# Patient Record
Sex: Female | Born: 1980 | Race: Asian | Hispanic: No | Marital: Single | Smoking: Never smoker
Health system: Southern US, Community
[De-identification: ages and names within clinical notes are randomized; demographics above are authoritative.]

## PROBLEM LIST (undated history)

## (undated) DIAGNOSIS — T7840XA Allergy, unspecified, initial encounter: Secondary | ICD-10-CM

## (undated) HISTORY — PX: WISDOM TOOTH EXTRACTION: SHX21

## (undated) HISTORY — DX: Allergy, unspecified, initial encounter: T78.40XA

---

## 2006-08-08 ENCOUNTER — Emergency Department (HOSPITAL_COMMUNITY): Admission: EM | Admit: 2006-08-08 | Discharge: 2006-08-08 | Payer: Self-pay | Admitting: Family Medicine

## 2008-01-12 ENCOUNTER — Emergency Department (HOSPITAL_COMMUNITY): Admission: EM | Admit: 2008-01-12 | Discharge: 2008-01-12 | Payer: Self-pay | Admitting: Family Medicine

## 2008-04-29 ENCOUNTER — Emergency Department (HOSPITAL_COMMUNITY): Admission: EM | Admit: 2008-04-29 | Discharge: 2008-04-29 | Payer: Self-pay | Admitting: Family Medicine

## 2008-08-14 ENCOUNTER — Emergency Department (HOSPITAL_COMMUNITY): Admission: EM | Admit: 2008-08-14 | Discharge: 2008-08-14 | Payer: Self-pay | Admitting: Family Medicine

## 2009-02-11 ENCOUNTER — Emergency Department (HOSPITAL_COMMUNITY): Admission: EM | Admit: 2009-02-11 | Discharge: 2009-02-11 | Payer: Self-pay | Admitting: Emergency Medicine

## 2009-02-16 ENCOUNTER — Emergency Department (HOSPITAL_COMMUNITY): Admission: EM | Admit: 2009-02-16 | Discharge: 2009-02-16 | Payer: Self-pay | Admitting: Family Medicine

## 2009-09-27 ENCOUNTER — Emergency Department (HOSPITAL_COMMUNITY): Admission: EM | Admit: 2009-09-27 | Discharge: 2009-09-27 | Payer: Self-pay | Admitting: Family Medicine

## 2009-12-29 ENCOUNTER — Emergency Department (HOSPITAL_COMMUNITY): Admission: EM | Admit: 2009-12-29 | Discharge: 2009-12-29 | Payer: Self-pay | Admitting: Family Medicine

## 2010-01-18 ENCOUNTER — Emergency Department (HOSPITAL_COMMUNITY): Admission: EM | Admit: 2010-01-18 | Discharge: 2010-01-18 | Payer: Self-pay | Admitting: Family Medicine

## 2010-04-03 ENCOUNTER — Emergency Department (HOSPITAL_COMMUNITY): Admission: EM | Admit: 2010-04-03 | Discharge: 2010-04-03 | Payer: Self-pay | Admitting: Family Medicine

## 2010-06-04 ENCOUNTER — Emergency Department (HOSPITAL_COMMUNITY): Admission: EM | Admit: 2010-06-04 | Discharge: 2010-06-04 | Payer: Self-pay | Admitting: Emergency Medicine

## 2011-03-10 LAB — POCT RAPID STREP A (OFFICE): Streptococcus, Group A Screen (Direct): NEGATIVE

## 2011-04-30 IMAGING — CR DG ANKLE COMPLETE 3+V*L*
3 series · 3 of 3 positions shown · non-contrast
Comparison: None

CLINICAL DATA: Left ankle injury.

LEFT ANKLE COMPLETE - 3+ VIEW

[view not recorded (1 of 3)]
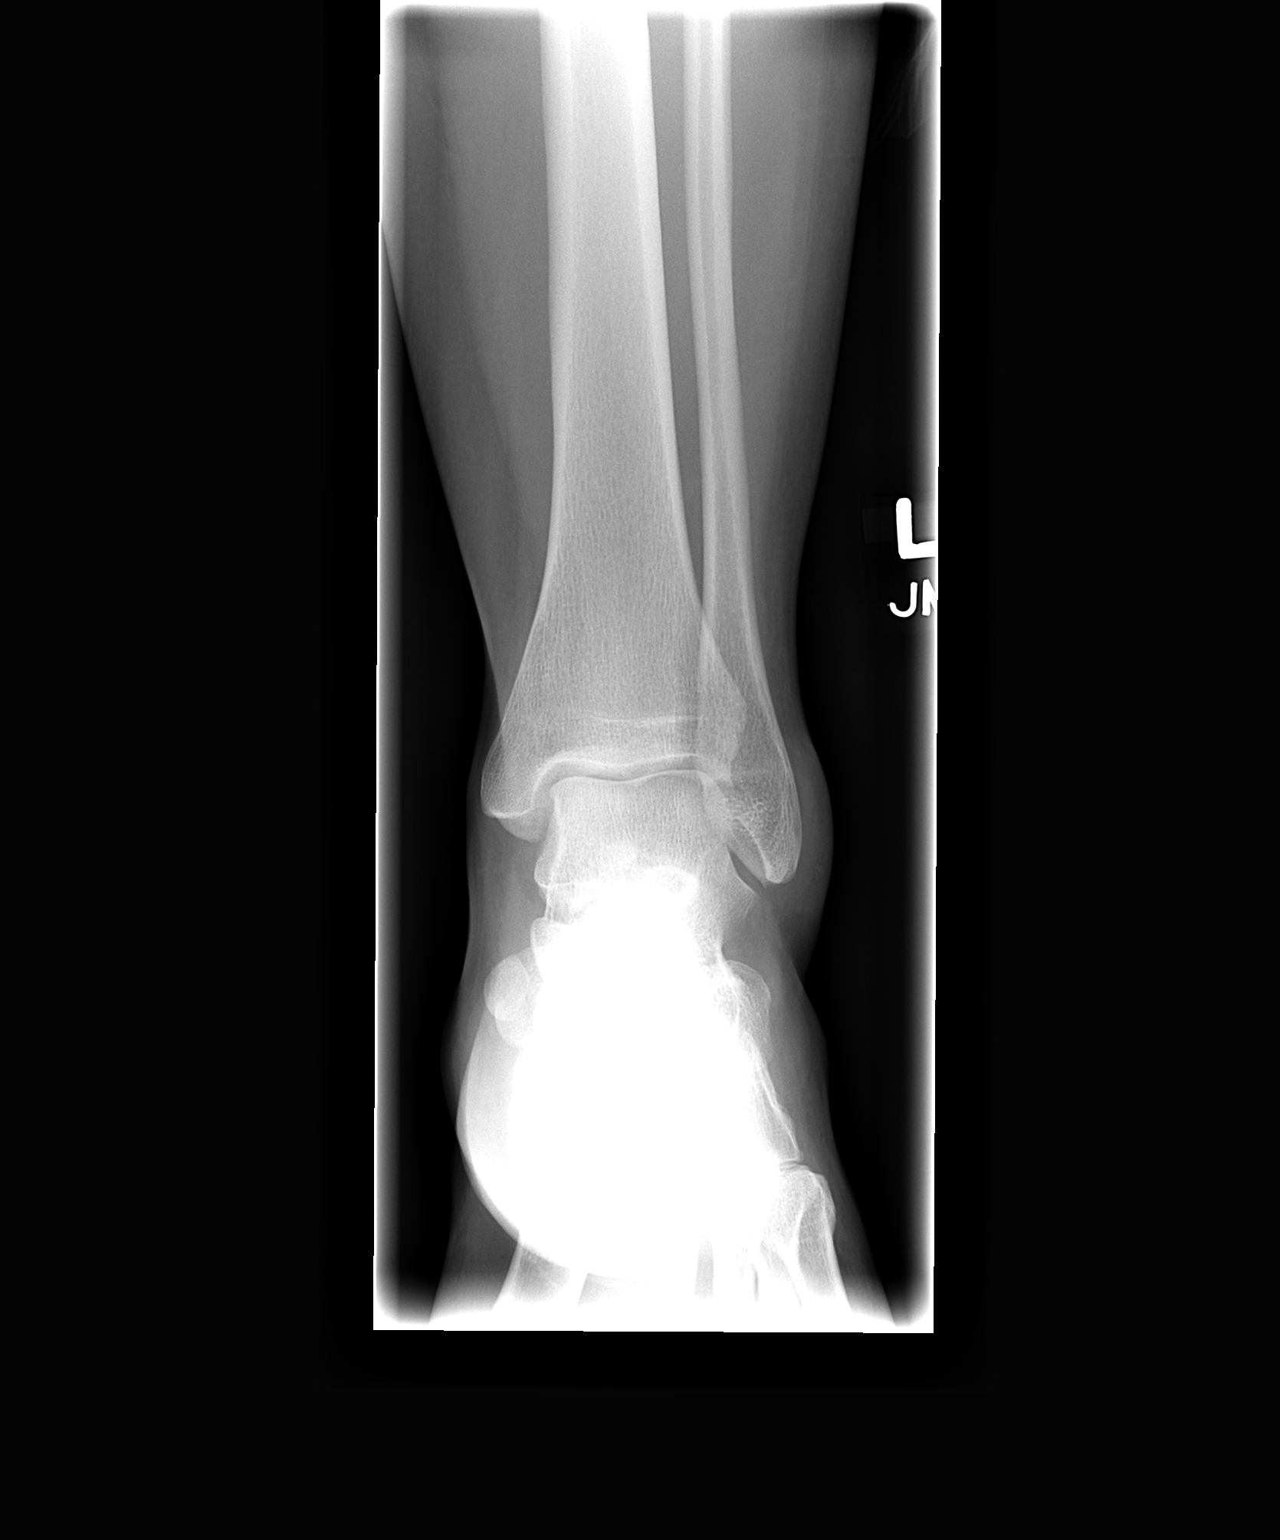

[view not recorded (2 of 3)]
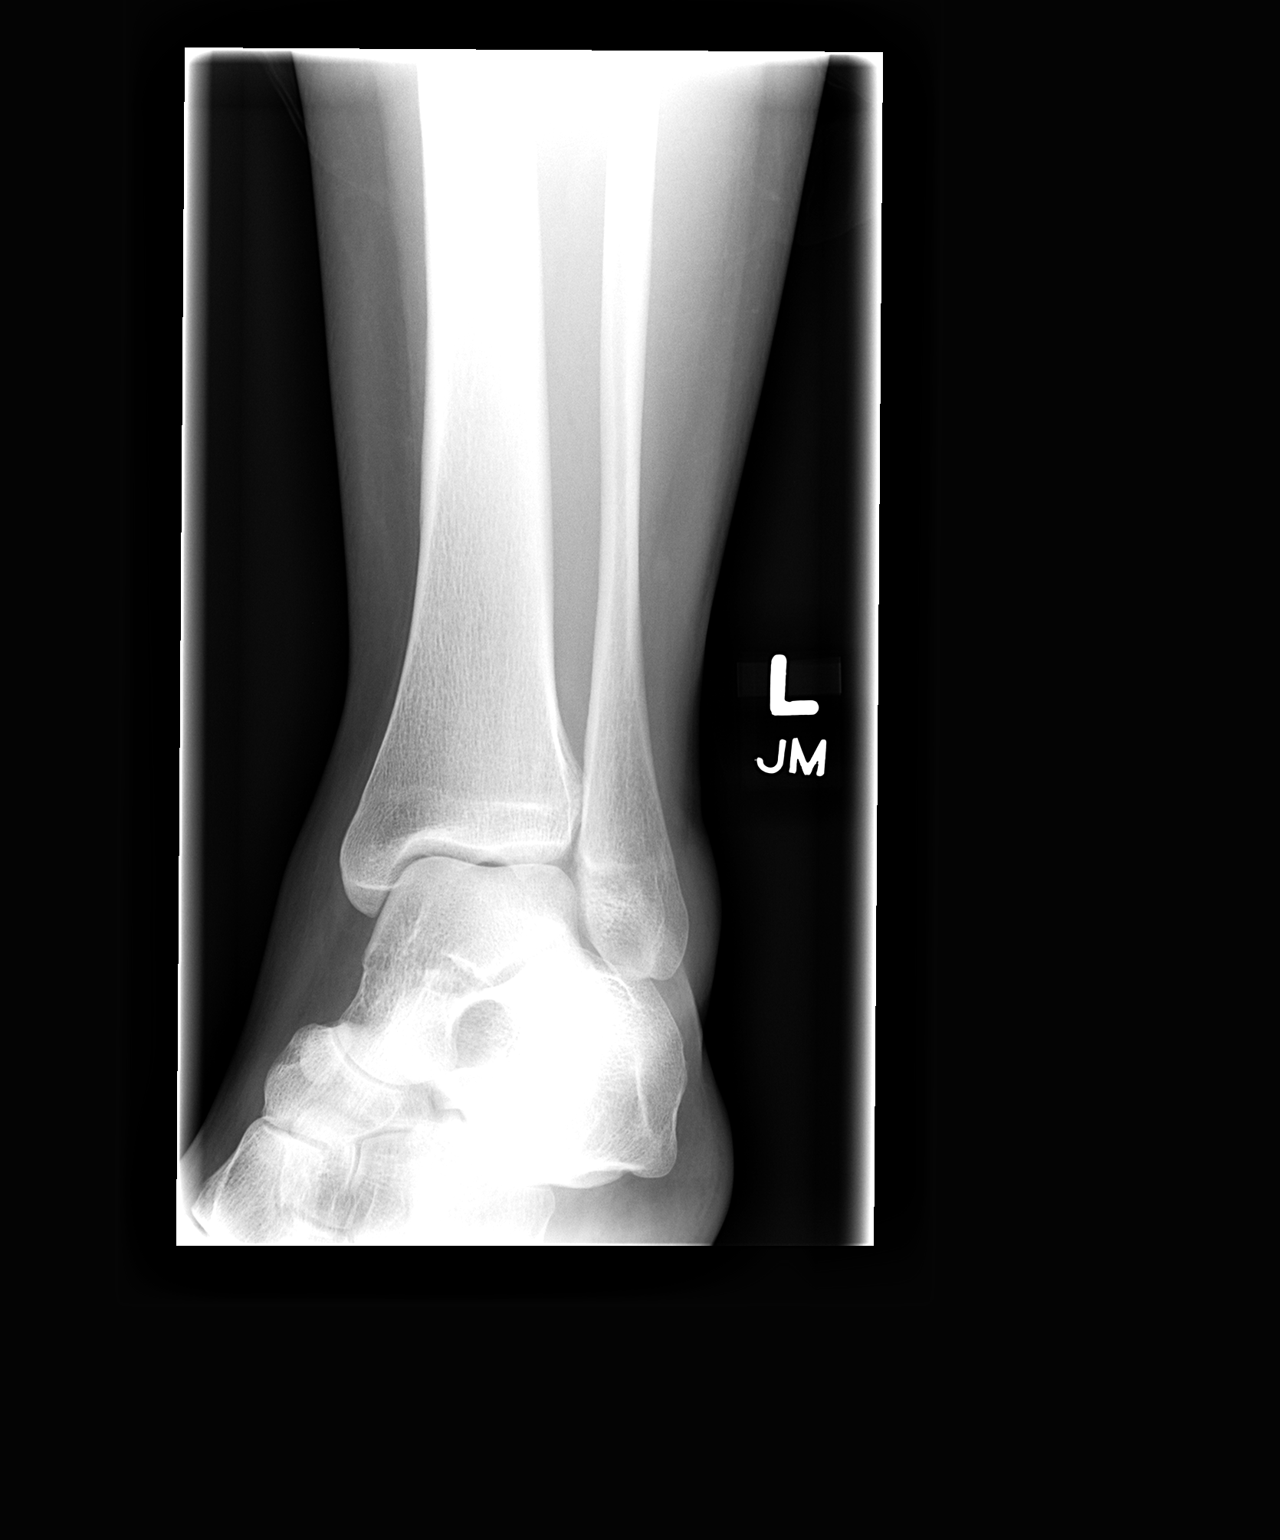

[view not recorded (3 of 3)]
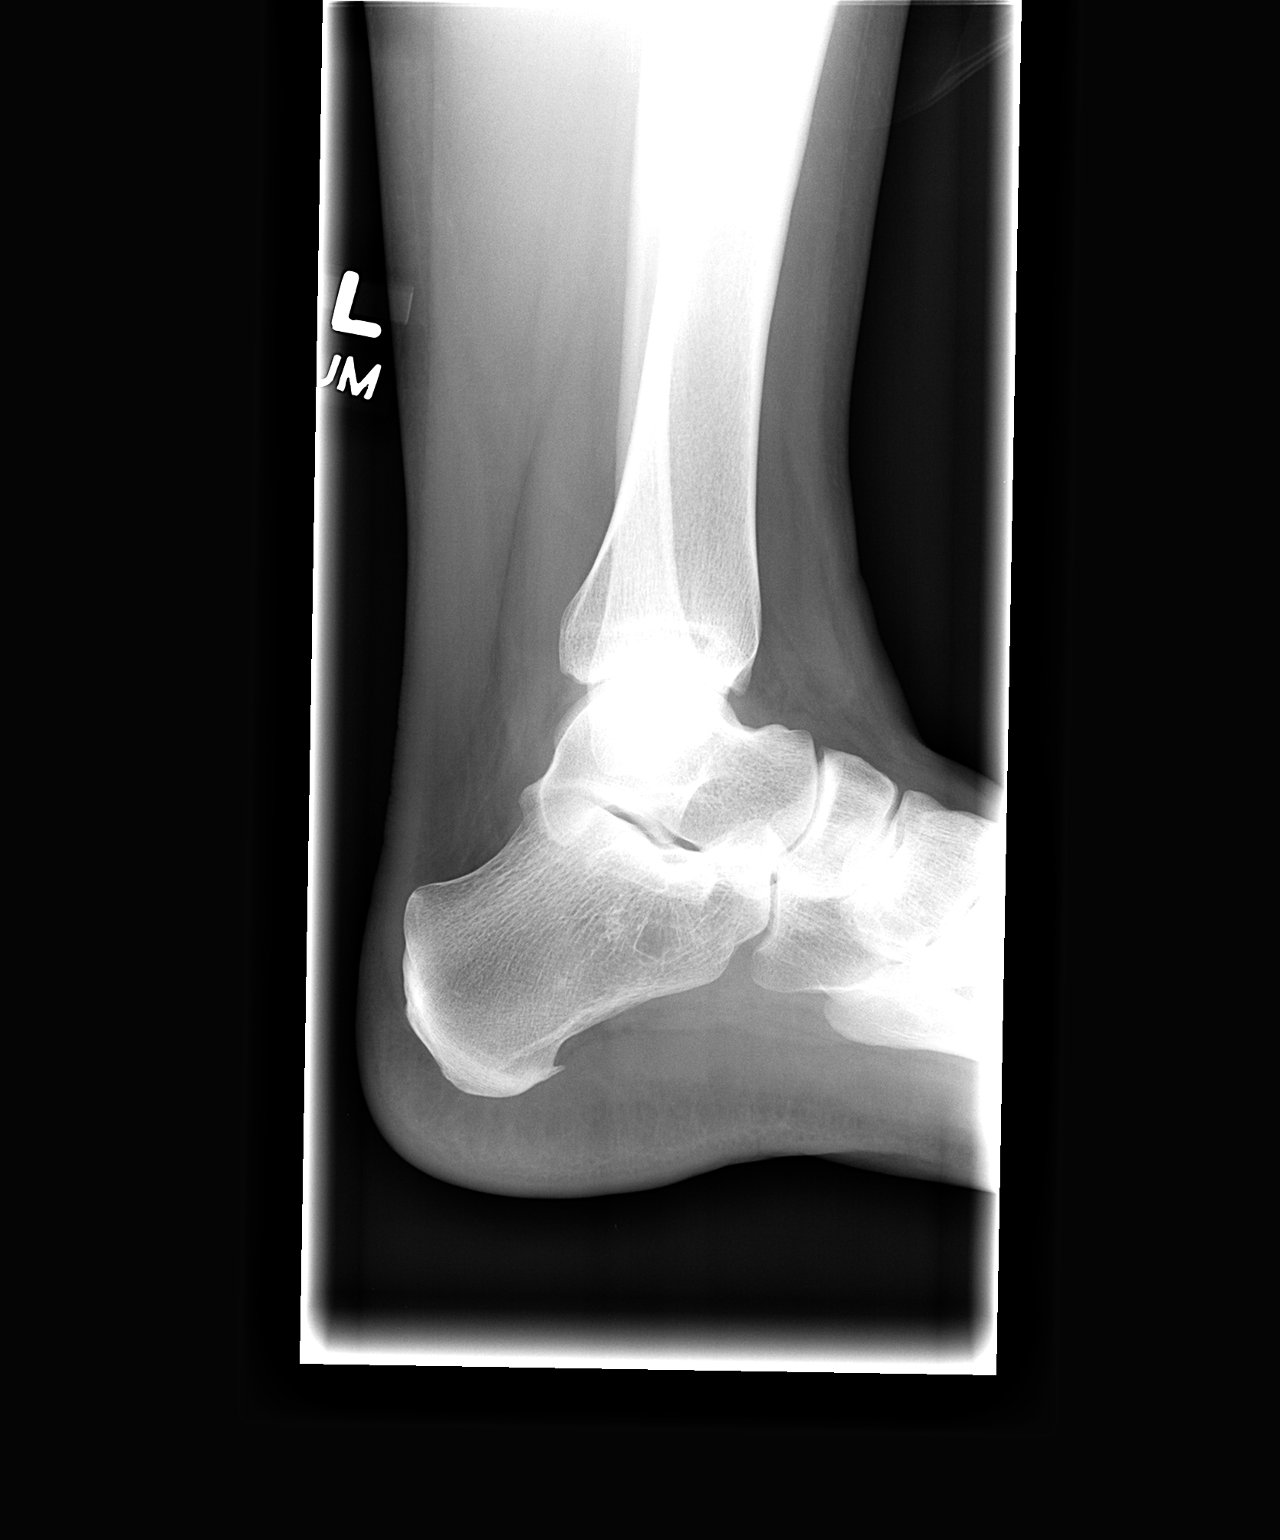

[3 of 3 positions shown; findings below may reference images not displayed]

FINDINGS: Three views of the left ankle were obtained.  There is a
small spur along the plantar aspect of the calcaneus.  Normal
alignment of the left ankle without fracture or dislocation. Soft
tissue swelling along the lateral ankle without acute fracture.
IMPRESSION: No acute bony abnormality to the left ankle.

## 2012-05-23 ENCOUNTER — Other Ambulatory Visit: Payer: Self-pay | Admitting: Obstetrics and Gynecology

## 2012-05-23 ENCOUNTER — Other Ambulatory Visit (HOSPITAL_COMMUNITY)
Admission: RE | Admit: 2012-05-23 | Discharge: 2012-05-23 | Disposition: A | Discharge status: Home / Self Care | Payer: PRIVATE HEALTH INSURANCE | Source: Ambulatory Visit | Attending: Obstetrics and Gynecology | Admitting: Obstetrics and Gynecology

## 2012-05-23 DIAGNOSIS — Z01419 Encounter for gynecological examination (general) (routine) without abnormal findings: Secondary | ICD-10-CM | POA: Insufficient documentation

## 2012-11-30 ENCOUNTER — Other Ambulatory Visit: Payer: Self-pay | Admitting: Obstetrics and Gynecology

## 2012-12-14 ENCOUNTER — Ambulatory Visit (INDEPENDENT_AMBULATORY_CARE_PROVIDER_SITE_OTHER): Payer: PRIVATE HEALTH INSURANCE | Admitting: Family Medicine

## 2012-12-14 VITALS — BP 144/95 | HR 58 | Temp 98.1°F | Resp 16 | Ht 67.0 in | Wt 173.8 lb

## 2012-12-14 DIAGNOSIS — J329 Chronic sinusitis, unspecified: Secondary | ICD-10-CM

## 2012-12-14 DIAGNOSIS — R05 Cough: Secondary | ICD-10-CM

## 2012-12-14 DIAGNOSIS — J40 Bronchitis, not specified as acute or chronic: Secondary | ICD-10-CM

## 2012-12-14 MED ORDER — AZITHROMYCIN 250 MG PO TABS: ORAL_TABLET | ORAL | Status: DC

## 2012-12-14 MED ORDER — HYDROCODONE-HOMATROPINE 5-1.5 MG/5ML PO SYRP: 5.0000 mL | ORAL_SOLUTION | Freq: Three times a day (TID) | ORAL | Status: DC | PRN

## 2012-12-14 NOTE — Progress Notes (Signed)
[facility] [address] [place] [address] [phone]  Date:  12/14/2012   Name:  KELCI PETRELLA   DOB:  June 01, 1981   MRN:  [id number]  PCP:  No primary provider on file.    Chief Complaint: Cough and Sore Throat   History of Present Illness:  VERMELLE CAMMARATA is a 32 y.o. very pleasant female patient who presents with the following:  She is here with illness for the last 10 days or so.  It seemed to be getting better, but then her symptoms came back.  She is coughing a lot- worse at night.  The cough is productive of clear mucus.  She also notes ear discomfort and congestion.     She has not noted a fever, no aches or chills- she did have aches and chills at the start of her illness.    No GI symptoms.   She does have a mild ST.  She notes sinus congestion especially in the am.   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    She is generally healthy LMP was about 6 weeks ago- she does extended regimen OCP.    There is no problem list on file for this patient.   Past Medical History  Diagnosis Date  . Allergy     Past Surgical History  Procedure Date  . Wisdom tooth extraction     History  Substance Use Topics  . Smoking status: Never Smoker   . Smokeless tobacco: Not on file  . Alcohol Use: Not on file    Family History  Problem Relation Age of Onset  . Hyperlipidemia Mother   . Hypertension Mother   . Diabetes Mother   . Diabetes Father   . Hypertension Father   . Hyperlipidemia Father   . Heart disease Father     Allergies  Allergen Reactions  . Penicillins     Medication list has been reviewed and updated.  Current Outpatient Prescriptions on File Prior to Visit  Medication Sig Dispense Refill  . Calcium Carbonate-Vitamin D (CALCIUM-VITAMIN D) 500-200 MG-UNIT per tablet Take 1 tablet by mouth 2 (two) times daily with a meal.      . cetirizine (ZYRTEC) 10 MG tablet Take 10 mg by mouth  daily.      Marland Kitchen levonorgestrel-ethinyl estradiol (SEASONALE,INTROVALE,JOLESSA) 0.15-0.03 MG tablet Take 1 tablet by mouth daily.        Review of Systems:  As per HPI- otherwise negative.   Physical Examination: Filed Vitals:   12/14/12 0932  BP: 144/95  Pulse: 58  Temp: 98.1 F (36.7 C)  Resp: 16   Filed Vitals:   12/14/12 0932  Height: 5\' 7"  (1.702 m)  Weight: 173 lb 12.8 oz (78.835 kg)   Body mass index is 27.22 kg/(m^2). Ideal Body Weight: Weight in (lb) to have BMI = 25: 159.3   GEN: WDWN, NAD, Non-toxic, A & O x 3 HEENT: Atraumatic, Normocephalic. Neck supple. No masses, No LAD. Bilateral TM wnl, oropharynx normal.  PEERL,EOMI.   Ears and Nose: No external deformity. CV: RRR, No M/G/R. No JVD. No thrill. No extra heart sounds. PULM: CTA B, no wheezes or rhonchi. No retractions. No resp. distress. No accessory muscle use.slight crackels right base ABD: S, NT, ND, +BS. No rebound. No HSM. EXTR: No c/c/e NEURO Normal gait.  PSYCH: Normally interactive. Conversant. Not depressed or anxious appearing.  Calm demeanor.    Assessment and  Plan: 1. Bronchitis  azithromycin (ZITHROMAX) 250 MG tablet, HYDROcodone-homatropine (HYCODAN) 5-1.5 MG/5ML syrup  2. Cough  HYDROcodone-homatropine (HYCODAN) 5-1.5 MG/5ML syrup  3. Sinusitis  azithromycin (ZITHROMAX) 250 MG tablet   Treat as above for sinusitis and bronchitis. Cautioned about combination of OCP and abx.  Let me know if not better, Sooner if worse.     Abbe Amsterdam, MD

## 2012-12-14 NOTE — Patient Instructions (Addendum)
Use the azithromycin as directed and the cough syrup as needed.  Let us know if you are not better in the next few days- Sooner if worse.

## 2013-01-28 ENCOUNTER — Other Ambulatory Visit: Payer: Self-pay | Admitting: Obstetrics and Gynecology

## 2013-02-27 ENCOUNTER — Ambulatory Visit (INDEPENDENT_AMBULATORY_CARE_PROVIDER_SITE_OTHER): Payer: PRIVATE HEALTH INSURANCE | Admitting: Family Medicine

## 2013-02-27 VITALS — BP 102/84 | HR 76 | Temp 98.6°F | Resp 16 | Ht 67.0 in | Wt 173.2 lb

## 2013-02-27 DIAGNOSIS — J029 Acute pharyngitis, unspecified: Secondary | ICD-10-CM

## 2013-02-27 DIAGNOSIS — J069 Acute upper respiratory infection, unspecified: Secondary | ICD-10-CM

## 2013-02-27 DIAGNOSIS — J020 Streptococcal pharyngitis: Secondary | ICD-10-CM

## 2013-02-27 DIAGNOSIS — R05 Cough: Secondary | ICD-10-CM

## 2013-02-27 DIAGNOSIS — J40 Bronchitis, not specified as acute or chronic: Secondary | ICD-10-CM

## 2013-02-27 LAB — POCT INFLUENZA A/B: Influenza B, POC: NEGATIVE

## 2013-02-27 LAB — POCT RAPID STREP A (OFFICE): Rapid Strep A Screen: NEGATIVE

## 2013-02-27 MED ORDER — IPRATROPIUM BROMIDE 0.03 % NA SOLN: 2.0000 | Freq: Two times a day (BID) | NASAL | Status: DC

## 2013-02-27 MED ORDER — HYDROCODONE-HOMATROPINE 5-1.5 MG/5ML PO SYRP: 5.0000 mL | ORAL_SOLUTION | Freq: Three times a day (TID) | ORAL | Status: DC | PRN

## 2013-02-27 MED ORDER — MUCINEX DM 30-600 MG PO TB12: 1.0000 | ORAL_TABLET | Freq: Two times a day (BID) | ORAL | Status: DC | PRN

## 2013-02-27 NOTE — Progress Notes (Signed)
[address]   [place], [place]  [address]   [phone]  Subjective:    Patient ID: Carrie Petersen, female    DOB: September 19, 1981, 31 y.o.   MRN: [id number]  HPI This 32 y.o. female presents for evaluation of sore throat, cough.  Onset three days ago with coughing.  Was in car with smokers on day before onset.  Scratchy throat x three days ago.  +ST two days ago; +pain with swallowing; no drooling or spitting; +body aches and malaise started today.  Coughing up brown sputum this morning.  No fever but +chills; no sweats.  +HA.  +ear pain L>R; +rhinorrhea clear; +nasal congestion.  +PND; +coughing; no SOB; no sneezing; no itchy eyes or nose.  Taking Nyquil or Dayquil.  No nausea, vomiting, diarrhea.  No rash.  No sick contacts.  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  S/p flu vaccine.  Was around nephew this weekend but not sure if he is sick. Takes Zyrtec daily for allergic rhinitis.  Non-smoker.  PCP:  [facility] [address].     Review of Systems  Constitutional: Positive for chills. Negative for fever, diaphoresis and fatigue.  HENT: Positive for ear pain, congestion, sore throat, rhinorrhea, trouble swallowing and postnasal drip. Negative for sneezing and voice change.   Eyes: Negative for itching.  Respiratory: Positive for cough. Negative for shortness of breath and wheezing.   Gastrointestinal: Negative for nausea, vomiting and diarrhea.  Skin: Negative for rash.  Neurological: Positive for headaches.        Past Medical History  Diagnosis Date  . Allergy     Past Surgical History  Procedure Laterality Date  . Wisdom tooth extraction      Prior to Admission medications   Medication Sig Start Date End Date Taking? Authorizing Provider  Ascorbic Acid (VITAMIN C) 100 MG tablet Take 100 mg by mouth daily.   Yes Historical Provider, MD  BIOTIN PO Take 1 tablet by mouth daily.   Yes Historical Provider, MD  Calcium Carbonate-Vitamin D (CALCIUM-VITAMIN D)  500-200 MG-UNIT per tablet Take 1 tablet by mouth 2 (two) times daily with a meal.   Yes Historical Provider, MD  cetirizine (ZYRTEC) 10 MG tablet Take 10 mg by mouth daily.   Yes Historical Provider, MD  fish oil-omega-3 fatty acids 1000 MG capsule Take 2 g by mouth daily.   Yes Historical Provider, MD  levonorgestrel-ethinyl estradiol (SEASONALE,INTROVALE,JOLESSA) 0.15-0.03 MG tablet Take 1 tablet by mouth daily.   Yes Historical Provider, MD  Multiple Vitamin (MULTIVITAMIN) tablet Take 1 tablet by mouth daily.   Yes Historical Provider, MD  azithromycin (ZITHROMAX) 250 MG tablet Use as a zpack 12/14/12   Gwenlyn Found Copland, MD  HYDROcodone-homatropine (HYCODAN) 5-1.5 MG/5ML syrup Take 5 mLs by mouth every 8 (eight) hours as needed for cough. 12/14/12   Pearline Cables, MD    Allergies  Allergen Reactions  . Penicillins     History   Social History  . Marital Status: Single    Spouse Name: N/A    Number of Children: N/A  . Years of Education: N/A   Occupational History  . nurse     [employer] on Pediatric floor and Med/Surg floor   Social History Main Topics  . Smoking status: Never Smoker   . Smokeless tobacco: Not on file  . Alcohol Use: 1.0 oz/week    2 drink(s) per week  . Drug Use: No  . Sexually Active: Yes  Birth Control/ Protection: Pill   Other Topics Concern  . Not on file   Social History Narrative  . No narrative on file    Family History  Problem Relation Age of Onset  . Hyperlipidemia Mother   . Hypertension Mother   . Diabetes Mother   . Diabetes Father   . Hypertension Father   . Hyperlipidemia Father   . Heart disease Father     Objective:   Physical Exam  Nursing note and vitals reviewed. Constitutional: She is oriented to person, place, and time. She appears well-developed and well-nourished. No distress.  HENT:  Head: Normocephalic and atraumatic.  Right Ear: External ear normal.  Left Ear: External ear normal.  Nose: Nose normal.    Mouth/Throat: Oropharynx is clear and moist.  Eyes: Conjunctivae are normal. Pupils are equal, round, and reactive to light.  Neck: Normal range of motion. Neck supple.  Cardiovascular: Normal rate, regular rhythm and normal heart sounds.   Pulmonary/Chest: Effort normal and breath sounds normal.  Lymphadenopathy:    She has cervical adenopathy.  Neurological: She is alert and oriented to person, place, and time.  Skin: No rash noted. She is not diaphoretic.  Psychiatric: She has a normal mood and affect. Her behavior is normal.    Results for orders placed in visit on 02/27/13  POCT INFLUENZA A/B      Result Value Range   Influenza A, POC Negative     Influenza B, POC Negative    POCT RAPID STREP A (OFFICE)      Result Value Range   Rapid Strep A Screen Negative  Negative       Assessment & Plan:  URI:  New.  Send throat culture.  Supportive care with Mucinex DM, Atrovent nasal spray, Hycodan cough syrup qhs.  Recommend Ibuprofen PRN sore throat, myalgias.  RTC for acute worsening; call in five days if no improvement.    Meds ordered this encounter  Medications  . BIOTIN PO    Sig: Take 1 tablet by mouth daily.  Marland Kitchen Dextromethorphan-Guaifenesin (MUCINEX DM) 30-600 MG TB12    Sig: Take 1 tablet by mouth 2 (two) times daily as needed.    Dispense:  28 each    Refill:  0  . ipratropium (ATROVENT) 0.03 % nasal spray    Sig: Place 2 sprays into the nose 2 (two) times daily.    Dispense:  30 mL    Refill:  5  . HYDROcodone-homatropine (HYCODAN) 5-1.5 MG/5ML syrup    Sig: Take 5 mLs by mouth every 8 (eight) hours as needed for cough.    Dispense:  180 mL    Refill:  0

## 2013-02-27 NOTE — Patient Instructions (Addendum)
Cough - Plan: POCT Influenza A/B, POCT rapid strep A, Dextromethorphan-Guaifenesin (MUCINEX DM) 30-600 MG TB12, HYDROcodone-homatropine (HYCODAN) 5-1.5 MG/5ML syrup  URI (upper respiratory infection) - Plan: ipratropium (ATROVENT) 0.03 % nasal spray  Streptococcal sore throat - Plan: POCT Influenza A/B, POCT rapid strep A, Culture, Group A Strep  Bronchitis - Plan: HYDROcodone-homatropine (HYCODAN) 5-1.5 MG/5ML syrup    CALL IN FIVE DAYS IF NO IMPROVEMENT IN SORE THROAT OR COUGH.

## 2013-03-01 LAB — CULTURE, GROUP A STREP: Organism ID, Bacteria: NORMAL

## 2013-04-13 ENCOUNTER — Encounter (HOSPITAL_COMMUNITY): Payer: Self-pay

## 2013-04-13 ENCOUNTER — Emergency Department (INDEPENDENT_AMBULATORY_CARE_PROVIDER_SITE_OTHER)
Admission: EM | Admit: 2013-04-13 | Discharge: 2013-04-13 | Disposition: A | Discharge status: Home / Self Care | Payer: Self-pay | Source: Home / Self Care

## 2013-04-13 DIAGNOSIS — L929 Granulomatous disorder of the skin and subcutaneous tissue, unspecified: Secondary | ICD-10-CM

## 2013-04-13 DIAGNOSIS — L980 Pyogenic granuloma: Secondary | ICD-10-CM

## 2013-04-13 MED ORDER — CEPHALEXIN 500 MG PO CAPS: 500.0000 mg | ORAL_CAPSULE | Freq: Three times a day (TID) | ORAL | Status: DC

## 2013-04-13 NOTE — ED Notes (Signed)
Removed her navel piercing for skin treatment, and then replaced it after treatment over. Now developing pain , swelling; concerned for skin infection

## 2013-04-13 NOTE — ED Provider Notes (Signed)
History     CSN: [id number]  Arrival date & time 04/13/13  1607   None     Chief Complaint  Patient presents with  . Cellulitis    (Consider location/radiation/quality/duration/timing/severity/associated sxs/prior treatment) HPI Comments: Pleasant 32 year old female who is concerned about an area of thickening just superior to the umbilicus where her navel piercing was. She recently underwent cold  sculpting procedure to help lose weight in the anterior abdomen. She had to remove the umbilical jewelry prior to that procedure. Later she began and only started 3 days later she developed localized pain. She states it was a little harder to get the piercing through. She has not noticed any redness, drainage or other signs of infection.    Past Medical History  Diagnosis Date  . Allergy     Past Surgical History  Procedure Laterality Date  . Wisdom tooth extraction      Family History  Problem Relation Age of Onset  . Hyperlipidemia Mother   . Hypertension Mother   . Diabetes Mother   . Diabetes Father   . Hypertension Father   . Hyperlipidemia Father   . Heart disease Father     History  Substance Use Topics  . Smoking status: Never Smoker   . Smokeless tobacco: Not on file  . Alcohol Use: 1.0 oz/week    2 drink(s) per week    OB History   Grav Para Term Preterm Abortions TAB SAB Ect Mult Living                  Review of Systems  Constitutional: Negative.   Respiratory: Negative.   Gastrointestinal: Negative.   Musculoskeletal: Negative.   Skin: Negative for color change, pallor, rash and wound.       As per history of present illness  Psychiatric/Behavioral: Negative.     Allergies  Penicillins  Home Medications   Current Outpatient Rx  Name  Route  Sig  Dispense  Refill  . Ascorbic Acid (VITAMIN C) 100 MG tablet   Oral   Take 100 mg by mouth daily.         Marland Kitchen azithromycin (ZITHROMAX) 250 MG tablet      Use as a zpack   6 tablet   0   .  BIOTIN PO   Oral   Take 1 tablet by mouth daily.         . Calcium Carbonate-Vitamin D (CALCIUM-VITAMIN D) 500-200 MG-UNIT per tablet   Oral   Take 1 tablet by mouth 2 (two) times daily with a meal.         . cephALEXin (KEFLEX) 500 MG capsule   Oral   Take 1 capsule (500 mg total) by mouth 3 (three) times daily.   21 capsule   0   . cetirizine (ZYRTEC) 10 MG tablet   Oral   Take 10 mg by mouth daily.         Marland Kitchen Dextromethorphan-Guaifenesin (MUCINEX DM) 30-600 MG TB12   Oral   Take 1 tablet by mouth 2 (two) times daily as needed.   28 each   0   . fish oil-omega-3 fatty acids 1000 MG capsule   Oral   Take 2 g by mouth daily.         Marland Kitchen HYDROcodone-homatropine (HYCODAN) 5-1.5 MG/5ML syrup   Oral   Take 5 mLs by mouth every 8 (eight) hours as needed for cough.   180 mL   0   . ipratropium (  ATROVENT) 0.03 % nasal spray   Nasal   Place 2 sprays into the nose 2 (two) times daily.   30 mL   5   . levonorgestrel-ethinyl estradiol (SEASONALE,INTROVALE,JOLESSA) 0.15-0.03 MG tablet   Oral   Take 1 tablet by mouth daily.         . Multiple Vitamin (MULTIVITAMIN) tablet   Oral   Take 1 tablet by mouth daily.           BP 127/76  Pulse 60  Temp(Src) 98.8 F (37.1 C) (Oral)  Resp 20  SpO2 100%  Physical Exam  Nursing note and vitals reviewed. Constitutional: She is oriented to person, place, and time. She appears well-developed and well-nourished. No distress.  Neck: Normal range of motion. Neck supple.  Pulmonary/Chest: Effort normal.  Abdominal: Soft. There is no tenderness.  Musculoskeletal: Normal range of motion. She exhibits no edema.  Neurological: She is alert and oriented to person, place, and time. She exhibits normal muscle tone.  Skin: Skin is warm and dry. No erythema.  There is an approximately 1 cm in diameter spherical shaped nodule 1-2 cm appears to the umbilicus. This lesion is tender but there is no surrounding redness, drainage,  lymphangitis or other signs of infection or dermal or subcutaneous lesions.  Psychiatric: She has a normal mood and affect.    ED Course  Procedures (including critical care time)  Labs Reviewed - No data to display No results found.   1. Granuloma of subcutaneous tissue       MDM  I suspect this subcutaneous nodule measuring approximately 1-1/2 cm in diameter located just below the body piercing marked is due to a granuloma. There may be scarring and thickening due to the body piercing. He has a variable is the cold sculpting itself. The cold application may have changed the tissue in some way to calls scarring or thickening of the area which had been pierced. Either way she is concerned that there may be an early abscess and she wants to take prophylactic measures. The tenderness is new however there may be an early infection within the granuloma. Keflex 500 mg 3 times a day for 7 days. If any new symptoms problems or worsening seek medical care promptly.         Hayden Rasmussen, NP 04/13/13 1755

## 2013-04-13 NOTE — ED Provider Notes (Signed)
Medical screening examination/treatment/procedure(s) were performed by non-physician practitioner and as supervising physician I was immediately available for consultation/collaboration.  Leslee Home, M.D.  Reuben Likes, MD 04/13/13 2003

## 2013-05-20 ENCOUNTER — Other Ambulatory Visit: Payer: Self-pay | Admitting: Obstetrics and Gynecology

## 2013-05-24 ENCOUNTER — Other Ambulatory Visit (HOSPITAL_COMMUNITY)
Admission: RE | Admit: 2013-05-24 | Discharge: 2013-05-24 | Disposition: A | Discharge status: Home / Self Care | Payer: Commercial Indemnity | Source: Ambulatory Visit | Attending: Obstetrics and Gynecology | Admitting: Obstetrics and Gynecology

## 2013-05-24 DIAGNOSIS — Z01419 Encounter for gynecological examination (general) (routine) without abnormal findings: Secondary | ICD-10-CM | POA: Insufficient documentation

## 2013-05-24 DIAGNOSIS — R87810 Cervical high risk human papillomavirus (HPV) DNA test positive: Secondary | ICD-10-CM | POA: Insufficient documentation

## 2013-05-24 DIAGNOSIS — Z1151 Encounter for screening for human papillomavirus (HPV): Secondary | ICD-10-CM | POA: Insufficient documentation

## 2016-07-21 ENCOUNTER — Encounter (HOSPITAL_COMMUNITY): Payer: Self-pay | Admitting: Emergency Medicine

## 2016-07-21 ENCOUNTER — Ambulatory Visit (HOSPITAL_COMMUNITY)
Admission: EM | Admit: 2016-07-21 | Discharge: 2016-07-21 | Disposition: A | Discharge status: Home / Self Care | Payer: BLUE CROSS/BLUE SHIELD | Attending: Family Medicine | Admitting: Family Medicine

## 2016-07-21 DIAGNOSIS — Z87440 Personal history of urinary (tract) infections: Secondary | ICD-10-CM | POA: Diagnosis not present

## 2016-07-21 DIAGNOSIS — Z8249 Family history of ischemic heart disease and other diseases of the circulatory system: Secondary | ICD-10-CM | POA: Diagnosis not present

## 2016-07-21 DIAGNOSIS — R300 Dysuria: Secondary | ICD-10-CM | POA: Diagnosis present

## 2016-07-21 DIAGNOSIS — N390 Urinary tract infection, site not specified: Secondary | ICD-10-CM | POA: Insufficient documentation

## 2016-07-21 DIAGNOSIS — Z880 Allergy status to penicillin: Secondary | ICD-10-CM | POA: Diagnosis not present

## 2016-07-21 DIAGNOSIS — Z833 Family history of diabetes mellitus: Secondary | ICD-10-CM | POA: Diagnosis not present

## 2016-07-21 LAB — POCT URINALYSIS DIP (DEVICE)
BILIRUBIN URINE: NEGATIVE
Glucose, UA: NEGATIVE mg/dL
Ketones, ur: NEGATIVE mg/dL
NITRITE: POSITIVE — AB
PH: 6 (ref 5.0–8.0)
Protein, ur: NEGATIVE mg/dL
SPECIFIC GRAVITY, URINE: 1.025 (ref 1.005–1.030)
Urobilinogen, UA: 0.2 mg/dL (ref 0.0–1.0)

## 2016-07-21 LAB — POCT PREGNANCY, URINE: Preg Test, Ur: NEGATIVE

## 2016-07-21 MED ORDER — SULFAMETHOXAZOLE-TRIMETHOPRIM 800-160 MG PO TABS: 1.0000 | ORAL_TABLET | Freq: Two times a day (BID) | ORAL | 0 refills | Status: AC

## 2016-07-21 NOTE — ED Provider Notes (Signed)
CSN: [id number]     Arrival date & time 07/21/16  1926 History   First MD Initiated Contact with Patient 07/21/16 2042     Chief Complaint  Patient presents with  . Urinary Tract Infection   (Consider location/radiation/quality/duration/timing/severity/associated sxs/prior Treatment) 35 year old female presents with dysuria, frequency, urgency and slight lower abdominal pain since yesterday. Denies any fever, chills or back pain. Has not taken any medication for symptoms. Previous UTI many years ago. Otherwise no chronic health issues.       Past Medical History:  Diagnosis Date  . Allergy    Past Surgical History:  Procedure Laterality Date  . WISDOM TOOTH EXTRACTION     Family History  Problem Relation Age of Onset  . Hyperlipidemia Mother   . Hypertension Mother   . Diabetes Mother   . Diabetes Father   . Hypertension Father   . Hyperlipidemia Father   . Heart disease Father    Social History  Substance Use Topics  . Smoking status: Never Smoker  . Smokeless tobacco: Never Used  . Alcohol use 1.0 oz/week    2 Standard drinks or equivalent per week   OB History    No data available     Review of Systems  Constitutional: Negative for chills, fatigue and fever.  Gastrointestinal: Positive for abdominal pain. Negative for nausea and vomiting.  Genitourinary: Positive for dysuria, frequency and urgency. Negative for vaginal bleeding.  Neurological: Negative for headaches.    Allergies  Penicillins  Home Medications   Prior to Admission medications   Medication Sig Start Date End Date Taking? Authorizing Provider  Ascorbic Acid (VITAMIN C) 100 MG tablet Take 100 mg by mouth daily.   Yes Historical Provider, MD  BIOTIN PO Take 1 tablet by mouth daily.   Yes Historical Provider, MD  Calcium Carbonate-Vitamin D (CALCIUM-VITAMIN D) 500-200 MG-UNIT per tablet Take 1 tablet by mouth 2 (two) times daily with a meal.   Yes Historical Provider, MD  cetirizine (ZYRTEC)  10 MG tablet Take 10 mg by mouth daily.   Yes Historical Provider, MD  Etonogestrel (NEXPLANON Enders) Inject into the skin.   Yes Historical Provider, MD  fish oil-omega-3 fatty acids 1000 MG capsule Take 2 g by mouth daily.   Yes Historical Provider, MD  Multiple Vitamin (MULTIVITAMIN) tablet Take 1 tablet by mouth daily.   Yes Historical Provider, MD  sulfamethoxazole-trimethoprim (BACTRIM DS,SEPTRA DS) 800-160 MG tablet Take 1 tablet by mouth 2 (two) times daily. 07/21/16 07/28/16  Sudie GrumblingAnn Berry Lailany Enoch, NP   Meds Ordered and Administered this Visit  Medications - No data to display  BP 122/75 (BP Location: Right Arm)   Pulse 61   Temp 97.9 F (36.6 C) (Oral)   Resp 20   LMP 07/09/2016   SpO2 100%  No data found.   Physical Exam  Constitutional: She is oriented to person, place, and time. She appears well-developed and well-nourished. No distress.  Cardiovascular: Normal rate and regular rhythm.   Abdominal: Soft. Normal appearance and bowel sounds are normal. She exhibits no mass. There is tenderness in the suprapubic area. There is no rebound, no guarding and no CVA tenderness.  Neurological: She is alert and oriented to person, place, and time.  Skin: Skin is warm and dry. Capillary refill takes less than 2 seconds.  Psychiatric: She has a normal mood and affect. Her behavior is normal. Judgment and thought content normal.    Urgent Care Course   Clinical Course  Procedures (including critical care time)  Labs Review Labs Reviewed  POCT URINALYSIS DIP (DEVICE) - Abnormal; Notable for the following:       Result Value   Hgb urine dipstick MODERATE (*)    Nitrite POSITIVE (*)    Leukocytes, UA SMALL (*)    All other components within normal limits  URINE CULTURE  POCT PREGNANCY, URINE    Imaging Review No results found.   Visual Acuity Review  Right Eye Distance:   Left Eye Distance:   Bilateral Distance:    Right Eye Near:   Left Eye Near:    Bilateral Near:          MDM   1. UTI (lower urinary tract infection)    Reviewed with patient that urinalysis was positive for WBC's, nitrites, and blood which support a diagnosis of UTI. Reocmmend Bactrim DS twice a day for 7 days. Encouraged to increase fluid intake. Urine sent for culture. Follow-up pending lab results or with your primary care provider if symptoms do not improve within 3 days.      Sudie GrumblingAnn Berry Durene Dodge, NP 07/21/16 2119

## 2016-07-21 NOTE — ED Triage Notes (Signed)
Here for UTI sx onset 1 days associated w/dysuria, urinary freq/urgency  Denies fevers, chills, abd pain  A&O x4... NAD

## 2016-07-21 NOTE — Discharge Instructions (Signed)
Start Bactrim twice a day as directed. Increase fluids. Follow-up with your primary care provider if symptoms do not improve within 3 days.

## 2016-07-24 LAB — URINE CULTURE

## 2016-07-29 ENCOUNTER — Telehealth (HOSPITAL_COMMUNITY): Payer: Self-pay | Admitting: Emergency Medicine

## 2016-07-29 NOTE — Telephone Encounter (Signed)
LM on pt's VM [phone] Need to see how pt is doing and to give lab results from recent visit on 8/17 Also let pt know labs can be obtained from MyChart

## 2016-07-29 NOTE — Telephone Encounter (Signed)
-----   Message from Eustace MooreLaura W Murray, MD sent at 07/24/2016 10:12 AM EDT ----- Please let patient know that urine culture was positive for E coli sensitive to trimethoprim/sulfa methoxazole.  [facility]  Recheck or followup PCP for further evaluation if symptoms persist.  LM
